# Patient Record
Sex: Female | Born: 2005 | Race: Black or African American | Hispanic: No | Marital: Single | Smoking: Never smoker
Health system: Southern US, Community
[De-identification: ages and names within clinical notes are randomized; demographics above are authoritative.]

## PROBLEM LIST (undated history)

## (undated) DIAGNOSIS — Q603 Renal hypoplasia, unilateral: Secondary | ICD-10-CM

## (undated) HISTORY — PX: OTHER SURGICAL HISTORY: SHX169

## (undated) HISTORY — PX: TONSILLECTOMY: SUR1361

---
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
## 2023-03-19 ENCOUNTER — Encounter (HOSPITAL_COMMUNITY): Payer: Self-pay | Admitting: Emergency Medicine

## 2023-03-19 ENCOUNTER — Other Ambulatory Visit: Payer: Self-pay

## 2023-03-19 ENCOUNTER — Ambulatory Visit (HOSPITAL_COMMUNITY)
Admission: EM | Admit: 2023-03-19 | Discharge: 2023-03-19 | Disposition: A | Discharge status: Home / Self Care | Payer: Medicaid Other | Attending: Internal Medicine | Admitting: Internal Medicine

## 2023-03-19 DIAGNOSIS — N898 Other specified noninflammatory disorders of vagina: Secondary | ICD-10-CM | POA: Diagnosis present

## 2023-03-19 DIAGNOSIS — K148 Other diseases of tongue: Secondary | ICD-10-CM | POA: Insufficient documentation

## 2023-03-19 DIAGNOSIS — Z113 Encounter for screening for infections with a predominantly sexual mode of transmission: Secondary | ICD-10-CM | POA: Diagnosis present

## 2023-03-19 LAB — POCT URINALYSIS DIP (MANUAL ENTRY)
Bilirubin, UA: NEGATIVE
Glucose, UA: NEGATIVE mg/dL
Ketones, POC UA: NEGATIVE mg/dL
Nitrite, UA: NEGATIVE
Protein Ur, POC: NEGATIVE mg/dL
Spec Grav, UA: 1.02 (ref 1.010–1.025)
Urobilinogen, UA: 0.2 U/dL
pH, UA: 7 (ref 5.0–8.0)

## 2023-03-19 LAB — POCT URINE PREGNANCY: Preg Test, Ur: NEGATIVE

## 2023-03-19 MED ORDER — VALACYCLOVIR HCL 1 G PO TABS: 1000.0000 mg | ORAL_TABLET | Freq: Two times a day (BID) | ORAL | 0 refills | Status: AC

## 2023-03-19 NOTE — Discharge Instructions (Signed)
I have prescribed you Valtrex given concerns of possible genital herpes.  Although, we will confirm this with swab that was performed today.  Your urine was clear.  Vaginal swab to test for STDs is also pending.  Please refrain from sexual activity at this time.

## 2023-03-19 NOTE — ED Provider Notes (Signed)
[facility]    CSN: [id number] Arrival date & time: 03/19/23  1029      History   Chief Complaint Chief Complaint  Patient presents with   SEXUALLY TRANSMITTED DISEASE    HPI Destiny Conner is a 17 y.o. female.   Patient presents with lesion to tongue and vaginal area that she noticed yesterday.  Reports that this has occurred previously to her genital area a few months ago.  She was treated with a medication that she is not sure the name of with improvement.  States that it seems to occur around her menstrual cycle.  She has had unprotected sexual intercourse recently but denies confirmed exposure to STD.  States that she did perform oral intercourse on someone who had similar lesions.  She denies dysuria, urinary frequency, vaginal discharge but patient would like cervicovaginal swab and urine test completed today.  Reports that she just completed her menstrual cycle.     Past Medical History:  Diagnosis Date   Hypoplasia of one kidney     Patient Active Problem List   Diagnosis Date Noted   Vaginal discharge 01/14/2022    Past Surgical History:  Procedure Laterality Date   supralin implant     TONSILLECTOMY      OB History   No obstetric history on file.      Home Medications    Prior to Admission medications   Medication Sig Start Date End Date Taking? Authorizing Provider  valACYclovir (VALTREX) 1000 MG tablet Take 1 tablet (1,000 mg total) by mouth 2 (two) times daily for 7 days. 03/19/23 03/26/23 Yes Jafeth Mustin, Acie Fredrickson, FNP  cephALEXin (KEFLEX) 500 MG capsule Take 1 capsule (500 mg total) by mouth 4 (four) times daily. Patient not taking: Reported on 03/19/2023 12/05/22   Cathren Laine, MD  doxycycline (VIBRAMYCIN) 100 MG capsule Take 1 capsule (100 mg total) by mouth 2 (two) times daily. Patient not taking: Reported on 03/19/2023 06/18/22   Achille Rich, PA-C  fluconazole (DIFLUCAN) 150 MG tablet Take 1 tablet (150 mg total) by mouth daily. Take  after you finish the antibiotic Patient not taking: Reported on 03/19/2023 06/18/22   Achille Rich, PA-C  ipratropium (ATROVENT) 0.06 % nasal spray Place 2 sprays into both nostrils 4 (four) times daily. Patient not taking: Reported on 03/19/2023 04/11/14   Charm Rings, MD  lisinopril (PRINIVIL,ZESTRIL) 2.5 MG tablet Take 2.5 mg by mouth daily. Patient not taking: Reported on 03/19/2023    [provider]  metroNIDAZOLE (FLAGYL) 500 MG tablet Take 1 tablet (500 mg total) by mouth 2 (two) times daily. Patient not taking: Reported on 03/19/2023 01/14/22   Evon Slack, PA-C  ondansetron (ZOFRAN ODT) 4 MG disintegrating tablet Take 1 tablet (4 mg total) by mouth every 8 (eight) hours as needed. Patient not taking: Reported on 03/19/2023 05/15/14   Antony Madura, PA-C  oseltamivir (TAMIFLU) 75 MG capsule Take 1 capsule (75 mg total) by mouth every 12 (twelve) hours. X 5 days Patient not taking: Reported on 03/19/2023 07/07/15   Lowanda Foster, NP    Family History History reviewed. No pertinent family history.  Social History Social History   Tobacco Use   Smoking status: Never   Smokeless tobacco: Never  Vaping Use   Vaping status: Never Used  Substance Use Topics   Alcohol use: Yes   Drug use: Yes    Types: Marijuana     Allergies   Ibuprofen   Review of Systems Review  of Systems Per HPI  Physical Exam Triage Vital Signs ED Triage Vitals  Encounter Vitals Group     BP 03/19/23 1111 111/72     Systolic BP Percentile --      Diastolic BP Percentile --      Pulse Rate 03/19/23 1111 68     Resp 03/19/23 1111 18     Temp 03/19/23 1111 98.7 F (37.1 C)     Temp Source 03/19/23 1111 Oral     SpO2 03/19/23 1111 98 %     Weight --      Height --      Head Circumference --      Peak Flow --      Pain Score 03/19/23 1108 7     Pain Loc --      Pain Education --      Exclude from Growth Chart --    No data found.  Updated Vital Signs BP 111/72 (BP Location:  Right Arm)   Pulse 68   Temp 98.7 F (37.1 C) (Oral)   Resp 18   LMP 03/14/2023   SpO2 98%   Visual Acuity Right Eye Distance:   Left Eye Distance:   Bilateral Distance:    Right Eye Near:   Left Eye Near:    Bilateral Near:     Physical Exam Exam conducted with a chaperone present.  Constitutional:      General: She is not in acute distress.    Appearance: Normal appearance. She is not toxic-appearing or diaphoretic.  HENT:     Head: Normocephalic and atraumatic.     Mouth/Throat:     Comments: Patient has vesicular-like lesions present to the lateral posterior portion of the tongue.  No drainage noted. Eyes:     Extraocular Movements: Extraocular movements intact.     Conjunctiva/sclera: Conjunctivae normal.  Pulmonary:     Effort: Pulmonary effort is normal.  Genitourinary:    Comments: Patient has vesicular cluster of lesions that have surrounding erythema present to right lower outer labia.  No drainage noted. Neurological:     General: No focal deficit present.     Mental Status: She is alert and oriented to person, place, and time. Mental status is at baseline.  Psychiatric:        Mood and Affect: Mood normal.        Behavior: Behavior normal.        Thought Content: Thought content normal.        Judgment: Judgment normal.      UC Treatments / Results  Labs (all labs ordered are listed, but only abnormal results are displayed) Labs Reviewed  POCT URINALYSIS DIP (MANUAL ENTRY) - Abnormal; Notable for the following components:      Result Value   Blood, UA trace-intact (*)    Leukocytes, UA Trace (*)    All other components within normal limits  HSV CULTURE AND TYPING  POCT URINE PREGNANCY  CERVICOVAGINAL ANCILLARY ONLY    EKG   Radiology No results found.  Procedures Procedures (including critical care time)  Medications Ordered in UC Medications - No data to display  Initial Impression / Assessment and Plan / UC Course  I have reviewed  the triage vital signs and the nursing notes.  Pertinent labs & imaging results that were available during my care of the patient were reviewed by me and considered in my medical decision making (see chart for details).     I am concerned that  patient's tongue lesions and genital lesions could be related as they appear possibly vesicular related to HSV.  Genital lesion swab pending for HSV to confirm.  Awaiting results.  Will opt to treat with Valtrex today as it appears the patient has been treated for this in the past but no swab was performed.  Patient requested UA but denies urinary symptoms.  UA unremarkable.  Cervicovaginal swab pending to test for STDs.  Advised patient to refrain from sexual activity until test results and treatment are complete.  Patient verbalized understanding and was agreeable with plan. Final Clinical Impressions(s) / UC Diagnoses   Final diagnoses:  Vaginal lesion  Screening examination for venereal disease  Tongue lesion     Discharge Instructions      I have prescribed you Valtrex given concerns of possible genital herpes.  Although, we will confirm this with swab that was performed today.  Your urine was clear.  Vaginal swab to test for STDs is also pending.  Please refrain from sexual activity at this time.    ED Prescriptions     Medication Sig Dispense Auth. Provider   valACYclovir (VALTREX) 1000 MG tablet Take 1 tablet (1,000 mg total) by mouth 2 (two) times daily for 7 days. 14 tablet [place], Acie Fredrickson, [place]      PDMP not reviewed this encounter.   Gustavus Bryant, [place] 03/19/23 1245

## 2023-03-19 NOTE — ED Notes (Signed)
Patient instructed to put on a gown for provider examination

## 2023-03-19 NOTE — ED Triage Notes (Signed)
Reports soreness and itching and sores to perineum and reports a sore on tongue.  These sores were noticed yesterday evening.    Reports this is the second time she has experienced this issue.  The first time, patient went to ed and they gave some medicine-unsure of details.   Patient says this has occurred after her menstrual cycle is done

## 2023-03-21 LAB — CERVICOVAGINAL ANCILLARY ONLY
Chlamydia: NEGATIVE
Comment: NEGATIVE
Comment: NEGATIVE
Comment: NORMAL
Neisseria Gonorrhea: NEGATIVE
Trichomonas: NEGATIVE

## 2023-03-24 LAB — HSV CULTURE AND TYPING

## 2023-10-15 ENCOUNTER — Ambulatory Visit

## 2024-02-23 ENCOUNTER — Other Ambulatory Visit: Payer: Self-pay

## 2024-02-23 ENCOUNTER — Ambulatory Visit (HOSPITAL_COMMUNITY)
Admission: RE | Admit: 2024-02-23 | Discharge: 2024-02-23 | Disposition: A | Discharge status: Home / Self Care | Source: Ambulatory Visit | Attending: Family Medicine | Admitting: Family Medicine

## 2024-02-23 VITALS — BP 114/74 | HR 82 | Temp 98.4°F | Resp 16

## 2024-02-23 DIAGNOSIS — N898 Other specified noninflammatory disorders of vagina: Secondary | ICD-10-CM | POA: Diagnosis present

## 2024-02-23 LAB — POCT URINE DIPSTICK
Bilirubin, UA: NEGATIVE
Blood, UA: NEGATIVE
Glucose, UA: NEGATIVE mg/dL
Ketones, POC UA: NEGATIVE mg/dL
Leukocytes, UA: NEGATIVE
Nitrite, UA: NEGATIVE
POC PROTEIN,UA: NEGATIVE
Spec Grav, UA: 1.015 (ref 1.010–1.025)
Urobilinogen, UA: 1 U/dL
pH, UA: 7.5 (ref 5.0–8.0)

## 2024-02-23 LAB — POCT URINE PREGNANCY: Preg Test, Ur: NEGATIVE

## 2024-02-23 MED ORDER — METRONIDAZOLE 500 MG PO TABS: 500.0000 mg | ORAL_TABLET | Freq: Two times a day (BID) | ORAL | 0 refills | Status: AC

## 2024-02-23 MED ORDER — FLUCONAZOLE 150 MG PO TABS: ORAL_TABLET | ORAL | 0 refills | Status: AC

## 2024-02-23 NOTE — Discharge Instructions (Signed)
We have sent testing for various causes of vaginal infections. We will notify you of any positive results once they are received. If required, we will prescribe any medications you might need.   

## 2024-02-23 NOTE — ED Triage Notes (Signed)
 Pt reports a dry vaginal discharge and odor . Pt thinks she has BV or yeast infection.

## 2024-02-23 NOTE — ED Provider Notes (Signed)
 [facility]   [id number] 02/23/24 Arrival Time: 1353  ASSESSMENT & PLAN:  1. Vaginal discharge   H/O BV and yeast.  Meds ordered this encounter  Medications   fluconazole  (DIFLUCAN ) 150 MG tablet    Sig: Take one tablet by mouth as a single dose. May repeat in 3 days if symptoms persist.    Dispense:  2 tablet    Refill:  0   metroNIDAZOLE  (FLAGYL ) 500 MG tablet    Sig: Take 1 tablet (500 mg total) by mouth 2 (two) times daily.    Dispense:  14 tablet    Refill:  0      Discharge Instructions      We have sent testing for various causes of vaginal infections. We will notify you of any positive results once they are received. If required, we will prescribe any medications you might need.      With s/s of PID.  Labs Reviewed  POCT URINE DIPSTICK - Abnormal; Notable for the following components:      Result Value   Clarity, UA cloudy (*)    All other components within normal limits  POCT URINE PREGNANCY  CERVICOVAGINAL ANCILLARY ONLY   Will notify of any positive results. Instructed to refrain from sexual activity for at least seven days.  Reviewed expectations re: course of current medical issues. Questions answered. Outlined signs and symptoms indicating need for more acute intervention. Patient verbalized understanding. After Visit Summary given.   SUBJECTIVE:  Destiny Conner is a 18 y.o. female who presents with complaint of vaginal discharge. Pt reports a dry vaginal discharge and odor . Pt thinks she has BV or yeast infection.  Patient's last menstrual period was 02/07/2024.   OBJECTIVE:  Vitals:   02/23/24 1424  BP: 114/74  Pulse: 82  Resp: 16  Temp: 98.4 F (36.9 C)  SpO2: 99%    General appearance: alert, cooperative, appears stated age and no distress GU: deferred Skin: warm and dry Psychological: alert and cooperative; normal mood and affect.  Results for orders placed or performed during the hospital encounter of 02/23/24   POC Urinalysis Dipstick   Collection Time: 02/23/24  2:29 PM  Result Value Ref Range   Color, UA yellow yellow   Clarity, UA cloudy (A) clear   Glucose, UA negative negative mg/dL   Bilirubin, UA negative negative   Ketones, POC UA negative negative mg/dL   Spec Grav, UA 8.984 8.989 - 1.025   Blood, UA negative negative   pH, UA 7.5 5.0 - 8.0   POC PROTEIN,UA negative negative, trace   Urobilinogen, UA 1.0 0.2 or 1.0 E.U./dL   Nitrite, UA Negative Negative   Leukocytes, UA Negative Negative  POCT urine pregnancy   Collection Time: 02/23/24  2:29 PM  Result Value Ref Range   Preg Test, Ur Negative Negative    Labs Reviewed  POCT URINE DIPSTICK - Abnormal; Notable for the following components:      Result Value   Clarity, UA cloudy (*)    All other components within normal limits  POCT URINE PREGNANCY  CERVICOVAGINAL ANCILLARY ONLY    Allergies  Allergen Reactions   Ibuprofen     Can't have due to kidney function, she has only one kidney    Past Medical History:  Diagnosis Date   Hypoplasia of one kidney    No family history on file. Social History   Socioeconomic History   Marital status: Single    Spouse name: Not  on file   Number of children: Not on file   Years of education: Not on file   Highest education level: Not on file  Occupational History   Not on file  Tobacco Use   Smoking status: Never   Smokeless tobacco: Never  Vaping Use   Vaping status: Never Used  Substance and Sexual Activity   Alcohol use: Yes   Drug use: Yes    Types: Marijuana   Sexual activity: Not on file  Other Topics Concern   Not on file  Social History Narrative   Not on file   Social Drivers of Health   Financial Resource Strain: Not on file  Food Insecurity: Not on file  Transportation Needs: Not on file  Physical Activity: Not on file  Stress: Not on file  Social Connections: Not on file  Intimate Partner Violence: Not on file           Rolinda Rogue,  MD 02/23/24 1511

## 2024-02-24 ENCOUNTER — Ambulatory Visit: Payer: Self-pay

## 2024-02-24 LAB — CERVICOVAGINAL ANCILLARY ONLY
Bacterial Vaginitis (gardnerella): POSITIVE — AB
Candida Glabrata: NEGATIVE
Candida Vaginitis: NEGATIVE
Chlamydia: NEGATIVE
Comment: NEGATIVE
Comment: NEGATIVE
Comment: NEGATIVE
Comment: NEGATIVE
Comment: NEGATIVE
Comment: NORMAL
Neisseria Gonorrhea: NEGATIVE
Trichomonas: NEGATIVE
